# Patient Record
Sex: Female | Born: 1985 | Race: White | Hispanic: No | Marital: Single | Smoking: Former smoker
Health system: Southern US, Community
[De-identification: ages and names within clinical notes are randomized; demographics above are authoritative.]

---

## 2014-03-27 ENCOUNTER — Emergency Department (HOSPITAL_COMMUNITY): Payer: Self-pay

## 2014-03-27 ENCOUNTER — Emergency Department (HOSPITAL_COMMUNITY)
Admission: EM | Admit: 2014-03-27 | Discharge: 2014-03-27 | Disposition: A | Discharge status: Home / Self Care | Payer: Self-pay | Attending: Emergency Medicine | Admitting: Emergency Medicine

## 2014-03-27 ENCOUNTER — Encounter (HOSPITAL_COMMUNITY): Payer: Self-pay | Admitting: Emergency Medicine

## 2014-03-27 DIAGNOSIS — Z3202 Encounter for pregnancy test, result negative: Secondary | ICD-10-CM | POA: Insufficient documentation

## 2014-03-27 DIAGNOSIS — F17200 Nicotine dependence, unspecified, uncomplicated: Secondary | ICD-10-CM | POA: Insufficient documentation

## 2014-03-27 DIAGNOSIS — R112 Nausea with vomiting, unspecified: Secondary | ICD-10-CM | POA: Insufficient documentation

## 2014-03-27 DIAGNOSIS — R1011 Right upper quadrant pain: Secondary | ICD-10-CM | POA: Insufficient documentation

## 2014-03-27 LAB — URINE MICROSCOPIC-ADD ON

## 2014-03-27 LAB — COMPREHENSIVE METABOLIC PANEL
ALT: 12 U/L (ref 0–35)
AST: 15 U/L (ref 0–37)
Albumin: 3.7 g/dL (ref 3.5–5.2)
Alkaline Phosphatase: 54 U/L (ref 39–117)
Anion gap: 13 (ref 5–15)
BUN: 11 mg/dL (ref 6–23)
CALCIUM: 8.7 mg/dL (ref 8.4–10.5)
CO2: 21 mEq/L (ref 19–32)
CREATININE: 0.68 mg/dL (ref 0.50–1.10)
Chloride: 102 mEq/L (ref 96–112)
GFR calc non Af Amer: >90 mL/min (ref >90)
GLUCOSE: 102 mg/dL — AB (ref 70–99)
Potassium: 3.8 mEq/L (ref 3.7–5.3)
Sodium: 136 mEq/L — ABNORMAL LOW (ref 137–147)
TOTAL PROTEIN: 6.7 g/dL (ref 6.0–8.3)
Total Bilirubin: 0.3 mg/dL (ref 0.3–1.2)

## 2014-03-27 LAB — CBC WITH DIFFERENTIAL/PLATELET
BASOS PCT: 1 % (ref 0–1)
Basophils Absolute: 0.1 10*3/uL (ref 0.0–0.1)
EOS ABS: 0.2 10*3/uL (ref 0.0–0.7)
EOS PCT: 3 % (ref 0–5)
HCT: 38.6 % (ref 36.0–46.0)
Hemoglobin: 13 g/dL (ref 12.0–15.0)
Lymphocytes Relative: 36 % (ref 12–46)
Lymphs Abs: 2.3 10*3/uL (ref 0.7–4.0)
MCH: 31.7 pg (ref 26.0–34.0)
MCHC: 33.7 g/dL (ref 30.0–36.0)
MCV: 94.1 fL (ref 78.0–100.0)
MONOS PCT: 8 % (ref 3–12)
Monocytes Absolute: 0.5 10*3/uL (ref 0.1–1.0)
Neutro Abs: 3.4 10*3/uL (ref 1.7–7.7)
Neutrophils Relative %: 52 % (ref 43–77)
Platelets: 189 10*3/uL (ref 150–400)
RBC: 4.1 MIL/uL (ref 3.87–5.11)
RDW: 13.5 % (ref 11.5–15.5)
WBC: 6.4 10*3/uL (ref 4.0–10.5)

## 2014-03-27 LAB — URINALYSIS, ROUTINE W REFLEX MICROSCOPIC
Bilirubin Urine: NEGATIVE
Glucose, UA: NEGATIVE mg/dL
HGB URINE DIPSTICK: NEGATIVE
Ketones, ur: NEGATIVE mg/dL
LEUKOCYTES UA: NEGATIVE
Nitrite: POSITIVE — AB
PROTEIN: NEGATIVE mg/dL
Specific Gravity, Urine: 1.021 (ref 1.005–1.030)
Urobilinogen, UA: 0.2 mg/dL (ref 0.0–1.0)
pH: 5.5 (ref 5.0–8.0)

## 2014-03-27 LAB — LIPASE, BLOOD: LIPASE: 31 U/L (ref 11–59)

## 2014-03-27 LAB — POC URINE PREG, ED: Preg Test, Ur: NEGATIVE

## 2014-03-27 MED ORDER — ONDANSETRON HCL 4 MG PO TABS: 4.0000 mg | ORAL_TABLET | Freq: Four times a day (QID) | ORAL | Status: AC

## 2014-03-27 MED ORDER — MORPHINE SULFATE 4 MG/ML IJ SOLN
4.0000 mg | Freq: Once | INTRAMUSCULAR | Status: AC
  Administered 2014-03-27: 4 mg via INTRAVENOUS
  Filled 2014-03-27: qty 1

## 2014-03-27 MED ORDER — ONDANSETRON HCL 4 MG/2ML IJ SOLN
4.0000 mg | Freq: Once | INTRAMUSCULAR | Status: AC
  Administered 2014-03-27: 4 mg via INTRAVENOUS
  Filled 2014-03-27: qty 2

## 2014-03-27 MED ORDER — DICYCLOMINE HCL 10 MG PO CAPS: 10.0000 mg | ORAL_CAPSULE | Freq: Once | ORAL | Status: DC

## 2014-03-27 MED ORDER — DICYCLOMINE HCL 20 MG PO TABS: 20.0000 mg | ORAL_TABLET | Freq: Two times a day (BID) | ORAL | Status: AC

## 2014-03-27 MED ORDER — OXYCODONE-ACETAMINOPHEN 5-325 MG PO TABS: 1.0000 | ORAL_TABLET | ORAL | Status: AC | PRN

## 2014-03-27 MED ORDER — SODIUM CHLORIDE 0.9 % IV BOLUS (SEPSIS)
1000.0000 mL | Freq: Once | INTRAVENOUS | Status: AC
  Administered 2014-03-27: 1000 mL via INTRAVENOUS

## 2014-03-27 NOTE — ED Notes (Signed)
Pt reports RUQ pain that started last night after eating japanese food. Feels like abd is bloating and having large amounts of gas and belching. Having nausea, denies vomiting or diarrhea.

## 2014-03-27 NOTE — Discharge Instructions (Signed)
Biliary Colic  °Biliary colic is a steady or irregular pain in the upper abdomen. It is usually under the right side of the rib cage. It happens when gallstones interfere with the normal flow of bile from the gallbladder. Bile is a liquid that helps to digest fats. Bile is made in the liver and stored in the gallbladder. When you eat a meal, bile passes from the gallbladder through the cystic duct and the common bile duct into the small intestine. There, it mixes with partially digested food. If a gallstone blocks either of these ducts, the normal flow of bile is blocked. The muscle cells in the bile duct contract forcefully to try to move the stone. This causes the pain of biliary colic.  °SYMPTOMS  °· A person with biliary colic usually complains of pain in the upper abdomen. This pain can be: °¨ In the center of the upper abdomen just below the breastbone. °¨ In the upper-right part of the abdomen, near the gallbladder and liver. °¨ Spread back toward the right shoulder blade. °· Nausea and vomiting. °· The pain usually occurs after eating. °· Biliary colic is usually triggered by the digestive system's demand for bile. The demand for bile is high after fatty meals. Symptoms can also occur when a person who has been fasting suddenly eats a very large meal. Most episodes of biliary colic pass after 1 to 5 hours. After the most intense pain passes, your abdomen may continue to ache mildly for about 24 hours. °DIAGNOSIS  °After you describe your symptoms, your caregiver will perform a physical exam. He or she will pay attention to the upper right portion of your belly (abdomen). This is the area of your liver and gallbladder. An ultrasound will help your caregiver look for gallstones. Specialized scans of the gallbladder may also be done. Blood tests may be done, especially if you have fever or if your pain persists. °PREVENTION  °Biliary colic can be prevented by controlling the risk factors for gallstones. Some of  these risk factors, such as heredity, increasing age, and pregnancy are a normal part of life. Obesity and a high-fat diet are risk factors you can change through a healthy lifestyle. Women going through menopause who take hormone replacement therapy (estrogen) are also more likely to develop biliary colic. °TREATMENT  °· Pain medication may be prescribed. °· You may be encouraged to eat a fat-free diet. °· If the first episode of biliary colic is severe, or episodes of colic keep retuning, surgery to remove the gallbladder (cholecystectomy) is usually recommended. This procedure can be done through small incisions using an instrument called a laparoscope. The procedure often requires a brief stay in the hospital. Some people can leave the hospital the same day. It is the most widely used treatment in people troubled by painful gallstones. It is effective and safe, with no complications in more than 90% of cases. °· If surgery cannot be done, medication that dissolves gallstones may be used. This medication is expensive and can take months or years to work. Only small stones will dissolve. °· Rarely, medication to dissolve gallstones is combined with a procedure called shock-wave lithotripsy. This procedure uses carefully aimed shock waves to break up gallstones. In many people treated with this procedure, gallstones form again within a few years. °PROGNOSIS  °If gallstones block your cystic duct or common bile duct, you are at risk for repeated episodes of biliary colic. There is also a 25% chance that you will develop   a gallbladder infection(acute cholecystitis), or some other complication of gallstones within 10 to 20 years. If you have surgery, schedule it at a time that is convenient for you and at a time when you are not sick. HOME CARE INSTRUCTIONS   Drink plenty of clear fluids.  Avoid fatty, greasy or fried foods, or any foods that make your pain worse.  Take medications as directed. SEEK MEDICAL  CARE IF:   You develop a fever over 100.5 F (38.1 C).  Your pain gets worse over time.  You develop nausea that prevents you from eating and drinking.  You develop vomiting. SEEK IMMEDIATE MEDICAL CARE IF:   You have continuous or severe belly (abdominal) pain which is not relieved with medications.  You develop nausea and vomiting which is not relieved with medications.  You have symptoms of biliary colic and you suddenly develop a fever and shaking chills. This may signal cholecystitis. Call your caregiver immediately.  You develop a yellow color to your skin or the white part of your eyes (jaundice). Document Released: 12/02/2005 Document Revised: 09/23/2011 Document Reviewed: 02/11/2008 [employer] Patient Information 2015 [place], [place]. This information is not intended to replace advice given to you by your health care provider. Make sure you discuss any questions you have with your health care provider.  Abdominal Pain Many things can cause belly (abdominal) pain. Most times, the belly pain is not dangerous. Many cases of belly pain can be watched and treated at home. HOME CARE   Do not take medicines that help you go poop (laxatives) unless told to by your doctor.  Only take medicine as told by your doctor.  Eat or drink as told by your doctor. Your doctor will tell you if you should be on a special diet. GET HELP IF:  You do not know what is causing your belly pain.  You have belly pain while you are sick to your stomach (nauseous) or have runny poop (diarrhea).  You have pain while you pee or poop.  Your belly pain wakes you up at night.  You have belly pain that gets worse or better when you eat.  You have belly pain that gets worse when you eat fatty foods.  You have a fever. GET HELP RIGHT AWAY IF:   The pain does not go away within 2 hours.  You keep throwing up (vomiting).  The pain changes and is only in the right or left part of the belly.  You  have bloody or tarry looking poop. MAKE SURE YOU:   Understand these instructions.  Will watch your condition.  Will get help right away if you are not doing well or get worse. Document Released: 12/18/2007 Document Revised: 07/06/2013 Document Reviewed: 03/10/2013 [facility] Patient Information 2015 [place], [place]. This information is not intended to replace advice given to you by your health care provider. Make sure you discuss any questions you have with your health care provider.   Emergency Department Resource Guide 1) Find a Doctor and Pay Out of Pocket Although you won't have to find out who is covered by your insurance plan, it is a good idea to ask around and get recommendations. You will then need to call the office and see if the doctor you have chosen will accept you as a new patient and what types of options they offer for patients who are self-pay. Some doctors offer discounts or will set up payment plans for their patients who do not have insurance, but you  will need to ask so you aren't surprised when you get to your appointment.  2) Contact Your Local Health Department Not all health departments have doctors that can see patients for sick visits, but many do, so it is worth a call to see if yours does. If you don't know where your local health department is, you can check in your phone book. The CDC also has a tool to help you locate your state's health department, and many state websites also have listings of all of their local health departments.  3) Find a Walk-in Clinic If your illness is not likely to be very severe or complicated, you may want to try a walk in clinic. These are popping up all over the country in pharmacies, drugstores, and shopping centers. They're usually staffed by nurse practitioners or physician assistants that have been trained to treat common illnesses and complaints. They're usually fairly quick and inexpensive. However, if you have serious medical  issues or chronic medical problems, these are probably not your best option.  No Primary Care Doctor: - Call Health Connect at  [phone] - they can help you locate a primary care doctor that  accepts your insurance, provides certain services, etc. - Physician Referral Service- [phone]  Chronic Pain Problems: Organization         Address  Phone   Notes  [facility]  [phone] Patients need to be referred by their primary care doctor.   Medication Assistance: Organization         Address  Phone   Notes  [facility] Medication [facility] [address] [place] [place], [place] [address] [phone] --Must be a resident of [facility] -- Must have NO insurance coverage whatsoever (no Medicaid/ Medicare, etc.) -- The pt. MUST have a primary care doctor that directs their care regularly and follows them in the community   [facility]  [phone]   [employer]  [phone]    Agencies that provide inexpensive medical care: Organization         Address  Phone   Notes  [facility]  [phone]   [facility]    [phone]   [facility] [address] [place], [place] [address] [phone]   [facility] [address] [place] [place] [phone]   Planned Parenthood    [phone]   [facility]    [phone]   [facility]  [address], [place] Phone:  [phone], Fax:  [phone] Hours of Operation:  9 am - 6 pm, M-F.  Also accepts Medicaid/Medicare and self-pay.  [facility]  [address], [place] Phone: [phone], Fax: [phone]. Hours of Operation:  8:30 am - 5:30 pm, M-F.  Also accepts Medicaid and self-pay.  [facility] [place] [address] [place] Phone: [phone]   [facility] [address] Natasha Bence [place], [place]  [phone], Ext. [phone] Mondays & Thursdays: 7-9 AM.  First 15 patients are seen on a first come, first serve basis.    Medicaid-accepting [facility] Providers:  Organization         Address  Phone   Notes  [facility] [address], [place] [phone] Also accepts self-pay patients.  [facility] [address] [place], [place] [address],  [place]  [phone]) [phone]   [facility] [address], [place] [phone]) [phone]   [facility] [address] [place] [address], [place] [phone]   Renaye RakersVeita Bland [address], [place]   [phone] Only accepts [place] Access [place] patients after they have their name applied to their card.   Self-Pay (no insurance) in [facility]:  Organization         Address  Phone   Notes  Sickle Cell Patients, [facility] [address], [place] [phone]   [facility] [address], [place] [phone]   [facility]  [address], [place] [phone]) [phone]   [facility]/Dr. Osei-Bonsu  [address] [place] [address], [place] or [phone], [place] [phone] Phone number for both [place] and [place] locations is the same.  [facility] [address], [place] [phone]   [facility] [address] [place] [address], [place] or [address] [phone]   [facility] [address], [place] [phone]) [phone], phone; [phone], fax Sees patients 1st and 3rd Saturday of every month.  Must not qualify for public or private insurance (i.e. Medicaid, Medicare, [place] Health Choice, Veterans' Benefits)  Household income should be no more than 200% of the poverty level The clinic cannot treat you if you are pregnant or think you are pregnant  Sexually transmitted  diseases are not treated at the clinic.    Dental Care: Organization         Address  Phone  Notes  [facility] [address], [place] [phone] Accepts children up to age 721 who are enrolled in [place] or [place] Health Choice; pregnant women with a Medicaid card; and children who have applied for Medicaid or [place] Health Choice, but were declined, whose parents can pay a reduced fee at time of service.  [facility] of [facility] [place]  [address], [place] [phone] Accepts children up to age 28 who are enrolled in [place] or [place] Health Choice; pregnant women with a Medicaid card; and children who have applied for Medicaid or [place] Health Choice, but were declined, whose parents can pay a reduced fee at time of service.  [facility]  [address], [place] [phone]) [phone] Patients are seen by appointment only. Walk-ins are not accepted. [facility] will see patients 28 years of age and older. Monday - Tuesday (8am-5pm) Most Wednesdays (8:30-5pm) $30 per visit, cash only  [facility]  [address], [facility] [place] [phone] Patients are seen by appointment only. Walk-ins are not accepted. [facility] will see patients 28 years of age and older. One Wednesday Evening (Monthly: Volunteer Based).  $30 per visit, cash only  [facility]  [phone] for adults; Children under age 184, call Graduate Pediatric Dentistry at [phone]. Children aged 64-14, please call [phone] to request a pediatric application.  Dental services are provided in all areas of dental care including fillings, crowns and bridges, complete and partial dentures, implants, gum treatment, root canals, and extractions. Preventive care is also provided. Treatment is provided to both adults and children. Patients are selected via a  lottery and  there is often a waiting list.   [facility] [address], [place]  [phone] [email or web address]   [facility] [address] [place], [place] [phone], Ext. [phone] Second and Fourth Thursday of each month, opens at 6:30 AM; Clinic ends at 9 AM.  Patients are seen on a first-come first-served basis, and a limited number are seen during each clinic.   [facility]  [address] [place], [place] [phone]   Eligibility Requirements You must have lived in [place], [place], or [place] counties for at least the last three months.   You cannot be eligible for state or federal sponsored [place], including [employer], [place], or [employer].   You generally cannot be eligible for healthcare insurance through your employer.    How to apply: Eligibility screenings are held every Tuesday and Wednesday afternoon from 1:00 pm until 4:00 pm. You do not need an appointment for the interview!  [facility] [address] [place], [place] [phone]   [facility] Health Department  [phone]   [facility] Health Department  [phone]   [facility] Health Department  [phone]    Behavioral Health Resources in the Community: Intensive Outpatient Programs Organization         Address  Phone  Notes  [facility] [address], [place], [place] [phone]   [facility]patient [address] [place], [place] [phone]   [facility] [address] [place], [place]  [phone]   [facility] Mental Health [address]  [place], [place] [phone] or [phone]   Substance Abuse Resources Organization         Address  Phone  Notes  [facility] Drug [facility]  [phone]   [facility]  [phone]   [facility] [place]  [phone]   Floydene Flock  [phone]   Residential &  Outpatient Substance Abuse Program  [phone]   Psychological Services Organization         Address  Phone  Notes  [facility] Health  [phone]   [facility] Services  [phone]   [facility] [address], [place] [phone] or [phone]    Mobile Crisis Teams Organization         Address  Phone  Notes  [facility], Mobile Crisis Care Unit  [phone]   [facility]  [address] [place], [place] [phone]   Doristine Locks [address] [place] [phone]    Self-Help/Support Groups Organization         Address  Phone             Notes  [facility] [place] - variety of support groups  [phone]- I7437963 Call for more information  Narcotics Anonymous (NA), [facility] [address], [employer] [place]  2 meetings at this location   Statistician         Address  Phone  Notes  [facility] [address],    [place]  [phone]   [facility]  [address] [place] [address], [place], [place] [phone]   [facility] [address] [place], [place] [phone] Admissions: 8am-3pm M-F  [facility] [address]    [place], [place] [phone]   [facility] [address] Olivia Lopez de Gutierrez, [place], [place] [phone]  [facility] [address],  [place], [place] [phone]   [facility] - Intensive Outpatient [address]urell Josephs [address], [place], [place] [phone]   [facility] ([facility] [address]  [place], [place] [phone] or [phone]   [facility] ([facility]) [address], [place], [place] [phone] Accepts Medicaid  [facility] [place] [address],  [place] [phone] Substance Abuse/Addiction Treatment   [facility] Organization          Address  Phone  Notes  [facility]  [phone]   Angie Fava, PhD [address] Ervin Knack [place], [place]   [phone] or [phone]   [facility]al   [address] [place], [place] [phone]   [facility] [address] [place], [place] [phone] Insurance/Medicaid/sponsorship through [facility] and Families [address]                                    [place], [place] [phone] Therapy/tele-psych/case  [facility] [address], [place] [phone]    Dr. Lolly Mustache  [phone]   [facility]  [employer] [facility] Dept. 1) [address], [place] 2) [address] [place] 3)  [address], [place] [phone]  [phone]   [facility] Child Abuse Hotline [phone] or [phone] (After Hours)

## 2014-03-27 NOTE — ED Provider Notes (Signed)
CSN: [id number]     Arrival date & time 03/27/14  1606 History   First MD Initiated Contact with Patient 03/27/14 1650     Chief Complaint  Patient presents with  . Abdominal Pain     (Consider location/radiation/quality/duration/timing/severity/associated sxs/prior Treatment) HPI Comments: Patient is a 28 year old female who presents to the emergency department for evaluation of abdominal pain. She reports she has been having constant, gradually worsening, sharp RUQ pain since last night. The pain began shortly after eating [place] food. She's had associated bloating and gas. She has an increase in belching. She has associated nausea without vomiting or diarrhea. She took an antacid without relief of her symptoms. She then tried using vinegar without success. She has had symptoms like this in the past after eating fatty foods. She has not had anything that has lasted this long in the past. She denies any prior abdominal surgeries. She still has her gallbladder.  Patient is a 28 y.o. female presenting with abdominal pain. The history is provided by the patient. No language interpreter was used.  Abdominal Pain Associated symptoms: nausea   Associated symptoms: no chest pain, no chills, no diarrhea, no fever, no shortness of breath and no vomiting     History reviewed. No pertinent past medical history. History reviewed. No pertinent past surgical history. History reviewed. No pertinent family history. History  Substance Use Topics  . Smoking status: Current Every Day Smoker    Types: Cigarettes  . Smokeless tobacco: Not on file  . Alcohol Use: Yes     Comment: occ beer   OB History   Grav Para Term Preterm Abortions TAB SAB Ect Mult Living                 Review of Systems  Constitutional: Negative for fever and chills.  Respiratory: Negative for shortness of breath.   Cardiovascular: Negative for chest pain.  Gastrointestinal: Positive for nausea and abdominal pain. Negative  for vomiting and diarrhea.  All other systems reviewed and are negative.     Allergies  Review of patient's allergies indicates no known allergies.  Home Medications   Prior to Admission medications   Not on File   BP 128/78  Pulse 99  Temp(Src) 97.7 F (36.5 C) (Oral)  Resp 16  Ht  (1.626 m)  Wt 130 lb (58.968 kg)  BMI 22.30 kg/m2  SpO2 98%  LMP 02/21/2014 Physical Exam  Nursing note and vitals reviewed. Constitutional: She is oriented to person, place, and time. She appears well-developed and well-nourished. No distress.  HENT:  Head: Normocephalic and atraumatic.  Right Ear: External ear normal.  Left Ear: External ear normal.  Nose: Nose normal.  Mouth/Throat: Oropharynx is clear and moist.  Eyes: Conjunctivae are normal.  Neck: Normal range of motion.  Cardiovascular: Normal rate, regular rhythm and normal heart sounds.   Pulmonary/Chest: Effort normal and breath sounds normal. No stridor. No respiratory distress. She has no wheezes. She has no rales.  Abdominal: Soft. She exhibits no distension. There is tenderness in the right upper quadrant. There is no rigidity, no rebound, no guarding and negative Murphy's sign.  Musculoskeletal: Normal range of motion.  Neurological: She is alert and oriented to person, place, and time. She has normal strength.  Skin: Skin is warm and dry. She is not diaphoretic. No erythema.  Psychiatric: She has a normal mood and affect. Her behavior is normal.    ED Course  Procedures (including critical care time)  Labs Review Labs Reviewed  COMPREHENSIVE METABOLIC PANEL - Abnormal; Notable for the following:    Sodium 136 (*)    Glucose, Bld 102 (*)    All other components within normal limits  URINALYSIS, ROUTINE W REFLEX MICROSCOPIC - Abnormal; Notable for the following:    Nitrite POSITIVE (*)    All other components within normal limits  URINE MICROSCOPIC-ADD ON - Abnormal; Notable for the following:    Bacteria, UA MANY  (*)    All other components within normal limits  URINE CULTURE  CBC WITH DIFFERENTIAL  LIPASE, BLOOD  POC URINE PREG, ED    Imaging Review US Abdomen Complete  03/27/2014   CLINICAL DATA:  Right upper quadrant pain.  EXAM: ULTRASOUND ABDOMEN COMPLETE  COMPARISON:  None.  FINDINGS: Gallbladder:  Gallbladder has a normal appearance. Gallbladder wall is 2.1 mm, within normal limits. No stones or pericholecystic fluid. No sonographic Murphy's sign.  Common bile duct:  Diameter: 2.2 mm  Liver:  No focal lesion identified. Within normal limits in parenchymal echogenicity.  IVC:  No abnormality visualized.  Pancreas:  Visualized portion unremarkable.  Spleen:  Size and appearance within normal limits.  Right Kidney:  Length: 10.0 cm. Echogenicity within normal limits. No mass or hydronephrosis visualized.  Left Kidney:  Length: 10.2 cm. Echogenicity within normal limits. No mass or hydronephrosis visualized.  Abdominal aorta:  1.6 cm  Other findings:  None.  IMPRESSION: No evidence for acute  abnormality.   Electronically Signed   By: Rosalie Gums M.D.   On: 03/27/2014 20:58     EKG Interpretation None      MDM   Final diagnoses:  Right upper quadrant abdominal pain    Patient is nontoxic, nonseptic appearing, in no apparent distress.  Patient's pain and other symptoms adequately managed in emergency department.  Fluid bolus given.  Labs, imaging and vitals reviewed.  Patient does not meet the SIRS or Sepsis criteria.  On repeat exam patient does not have a surgical abdomin and there are no peritoneal signs.  No indication of appendicitis, bowel obstruction, bowel perforation, cholecystitis, diverticulitis, PID or ectopic pregnancy.  I believe patient's sx are likely related to biliary colic. Encouraged patient follow up with PCP for further workup. Patient discharged home with symptomatic treatment.  I have also discussed reasons to return immediately to the ER.  Patient expresses understanding and  agrees with plan. Discussed case with Dr. Rubin Payor who agrees with plan. Patient / Family / Caregiver informed of clinical course, understand medical decision-making process, and agree with plan.        Mora Bellman, PA-C 03/27/14 2322

## 2014-03-27 NOTE — ED Provider Notes (Signed)
Medical screening examination/treatment/procedure(s) were performed by non-physician practitioner and as supervising physician I was immediately available for consultation/collaboration.   EKG Interpretation None       Juliet Rude. Rubin Payor, MD 03/27/14 [phone]

## 2014-03-31 LAB — URINE CULTURE: Colony Count: >=100000

## 2014-04-01 ENCOUNTER — Telehealth (HOSPITAL_BASED_OUTPATIENT_CLINIC_OR_DEPARTMENT_OTHER): Payer: Self-pay | Admitting: Emergency Medicine

## 2014-04-01 NOTE — Telephone Encounter (Signed)
Post ED Visit - Positive Culture Follow-up  Culture report reviewed by antimicrobial stewardship pharmacist:  Wes Dulaney, Pharm.D., BCPS  Celedonio Miyamoto, Pharm.D., BCPS  Georgina Pillion, [address].D., BCPS  [place], [address].D., BCPS, AAHIVP  Estella Husk, Pharm.D., BCPS, AAHIVP  Carly Sabat, Pharm.D.  Enzo Bi, [address].D.  Positive urine culture >100,000 colonies Staphylococcus coag negative, E. Coli Treated with none, followup with Arthor Captain PA, no further patient follow-up is required at this time.  Berle Mull 04/01/2014, 11:53 AM

## 2015-07-10 ENCOUNTER — Emergency Department (HOSPITAL_COMMUNITY)
Admission: EM | Admit: 2015-07-10 | Discharge: 2015-07-10 | Disposition: A | Discharge status: Home / Self Care | Payer: Medicaid - Out of State | Attending: Emergency Medicine | Admitting: Emergency Medicine

## 2015-07-10 ENCOUNTER — Encounter (HOSPITAL_COMMUNITY): Payer: Self-pay | Admitting: *Deleted

## 2015-07-10 DIAGNOSIS — Z79899 Other long term (current) drug therapy: Secondary | ICD-10-CM | POA: Diagnosis not present

## 2015-07-10 DIAGNOSIS — R51 Headache: Secondary | ICD-10-CM | POA: Diagnosis not present

## 2015-07-10 DIAGNOSIS — F17210 Nicotine dependence, cigarettes, uncomplicated: Secondary | ICD-10-CM | POA: Insufficient documentation

## 2015-07-10 DIAGNOSIS — I889 Nonspecific lymphadenitis, unspecified: Secondary | ICD-10-CM | POA: Diagnosis not present

## 2015-07-10 DIAGNOSIS — R220 Localized swelling, mass and lump, head: Secondary | ICD-10-CM | POA: Diagnosis present

## 2015-07-10 NOTE — ED Notes (Signed)
Pt reports a "knot" behind her rt ear. Pt states that it has been there for 4 days. Pt states that it is tender to palpation and movement.

## 2015-07-10 NOTE — ED Notes (Signed)
Declined W/C at D/C and was escorted to lobby by RN. 

## 2015-07-10 NOTE — ED Notes (Signed)
Called patient x2 with no answer

## 2015-07-10 NOTE — Discharge Instructions (Signed)

## 2015-07-10 NOTE — ED Provider Notes (Signed)
CSN: [id number]     Arrival date & time 07/10/15  1653 History  By signing my name below, I, Jarvis Morganaylor Ferguson, attest that this documentation has been prepared under the direction and in the presence of Roxy Horsemanobert Sebrena Engh, PA-C Electronically Signed: Jarvis Morganaylor Ferguson, ED Scribe. 07/10/2015. 5:38 PM.    No chief complaint on file.  The history is provided by the patient. No language interpreter was used.    Jon GillsHeather D Demetrius is a 29 y.o. female who presents to the Emergency Department with a chief complaint of a knot behind her right ear onset 4 days. She states the pain radiates throughout her entire head. She reports the area is tender to palpation and is exacerbated with movement of her head. Pt has been taking Ibuprofen with only moderate relief. She denies any recent illness. Pt notes she has not had anything like this in the past. Pt denies any redness or warmth to the area, drainage, fever, or other associated symptoms  History reviewed. No pertinent past medical history. History reviewed. No pertinent past surgical history. No family history on file. Social History  Substance Use Topics  . Smoking status: Current Every Day Smoker    Types: Cigarettes  . Smokeless tobacco: None  . Alcohol Use: Yes     Comment: occ beer   OB History    No data available     Review of Systems  HENT: Negative for ear pain.   Skin:       "knot" behind right ear  Neurological: Positive for headaches.      Allergies  Codeine  Home Medications   Prior to Admission medications   Medication Sig Start Date End Date Taking? Authorizing Provider  calcium carbonate (TUMS - DOSED IN MG ELEMENTAL CALCIUM) 500 MG chewable tablet Chew 1 tablet by mouth daily as needed for indigestion or heartburn.    Historical Provider, MD  dicyclomine (BENTYL) 20 MG tablet Take 1 tablet (20 mg total) by mouth 2 (two) times daily. 03/27/14   Junious SilkHannah Merrell, PA-C  ondansetron (ZOFRAN) 4 MG tablet Take 1 tablet (4 mg total)  by mouth every 6 (six) hours. 03/27/14   Junious SilkHannah Merrell, PA-C  oxyCODONE-acetaminophen (PERCOCET/ROXICET) 5-325 MG per tablet Take 1 tablet by mouth every 4 (four) hours as needed for severe pain. May take 2 tablets PO q 6 hours for severe pain - Do not take with Tylenol as this tablet already contains tylenol 03/27/14   Junious SilkHannah Merrell, PA-C   Triage Vitals: BP 121/76 mmHg  Pulse 81  Temp(Src) 98.5 F (36.9 C) (Oral)  Resp 16  SpO2 97%  Physical Exam  Constitutional: She is oriented to person, place, and time. She appears well-developed and well-nourished. No distress.  HENT:  Head: Normocephalic and atraumatic.  Swollen tender right postauricular lymph node, no surrounding cellulitis, erythema, or sign of abscess or infection Right TM is clear  Eyes: Conjunctivae and EOM are normal.  Neck: Neck supple. No tracheal deviation present.  Cardiovascular: Normal rate.   Pulmonary/Chest: Effort normal. No respiratory distress.  Musculoskeletal: Normal range of motion.  Neurological: She is alert and oriented to person, place, and time.  Skin: Skin is warm and dry.  Psychiatric: She has a normal mood and affect. Her behavior is normal.  Nursing note and vitals reviewed.   ED Course  Procedures (including critical care time)  DIAGNOSTIC STUDIES: Oxygen Saturation is 97% on RA, normal by my interpretation.    COORDINATION OF CARE:   MDM  Final diagnoses:  Lymphadenitis    Patient with right swollen postauricular lymph node. Treat with warm compresses, Tylenol, and ibuprofen.  I personally performed the services described in this documentation, which was scribed in my presence. The recorded information has been reviewed and is accurate.      Roxy Horseman, PA-C 07/10/15 1759  Derwood Kaplan, MD 07/11/15 [phone]

## 2016-02-26 IMAGING — US US ABDOMEN COMPLETE
1 series · 14 of 25 positions shown · non-contrast
Comparison: None.

CLINICAL DATA: Right upper quadrant pain.

EXAM:
ULTRASOUND ABDOMEN COMPLETE

[Series 1: us abdomen complete · 0.22mm/px · 14 of 61 slices shown]
[im 1/61]
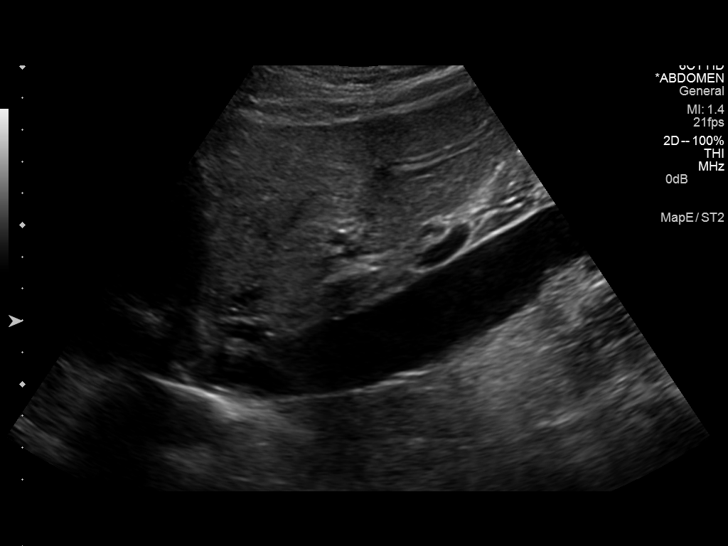
[im 6/61]
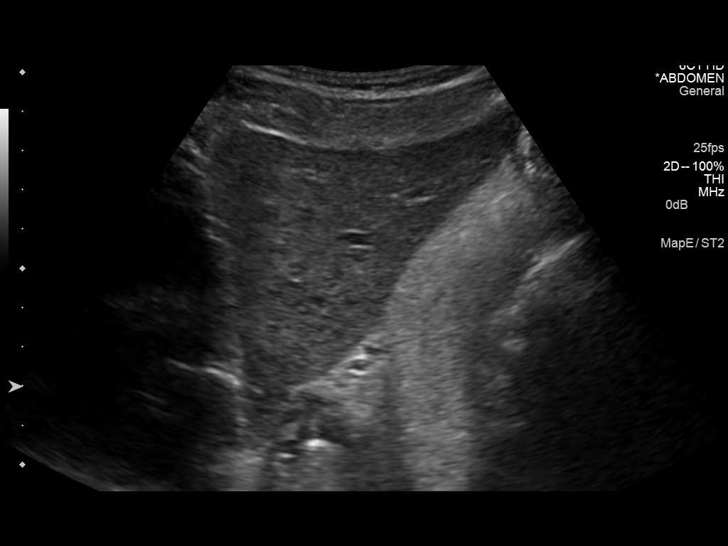
[im 11/61]
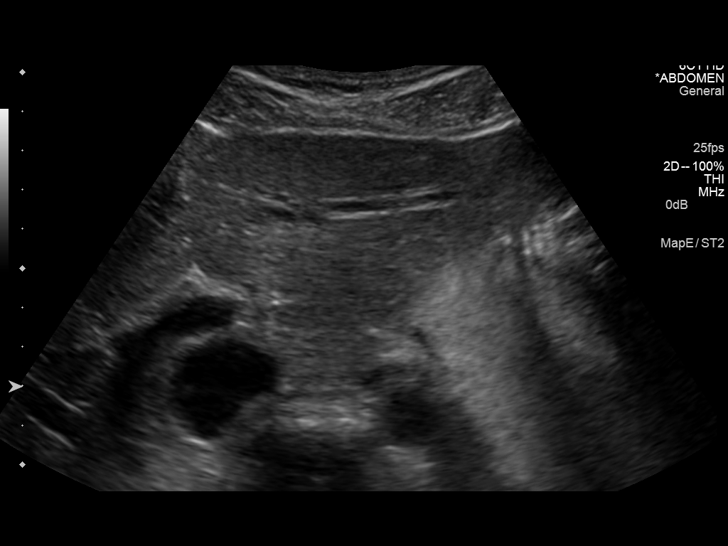
[im 16/61]
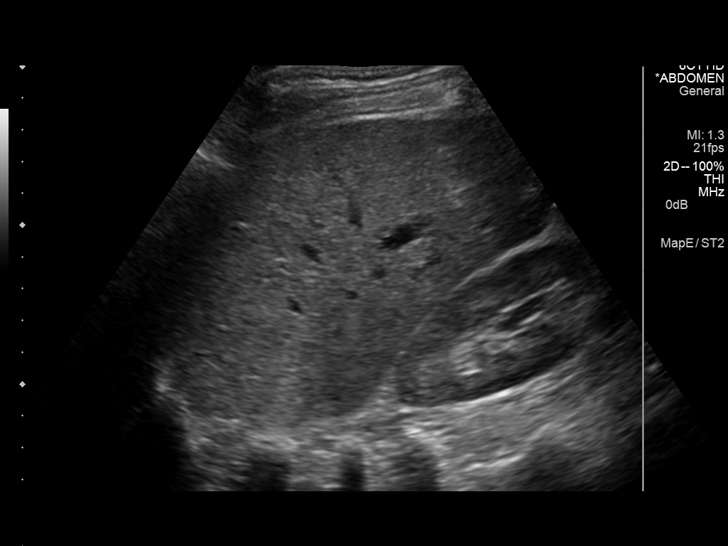
[im 21/61]
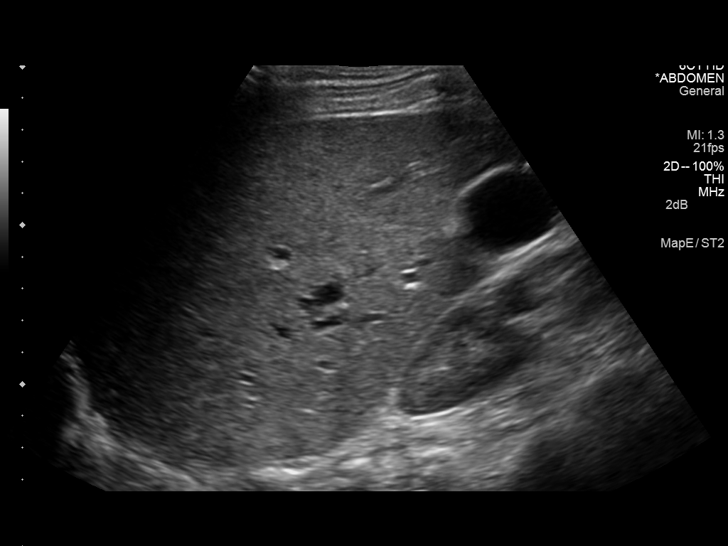
[im 23/61]
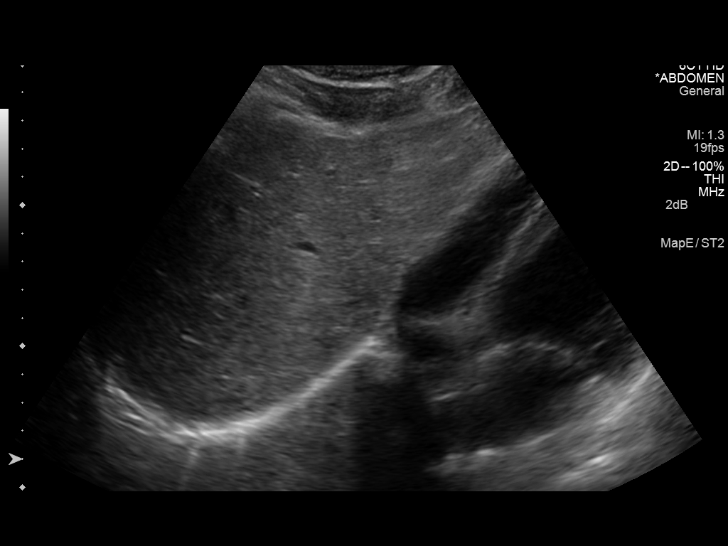
[im 28/61]
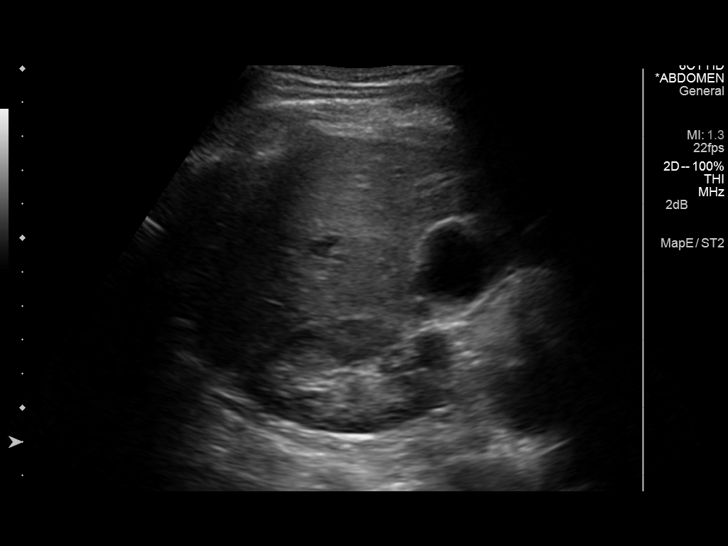
[im 33/61]
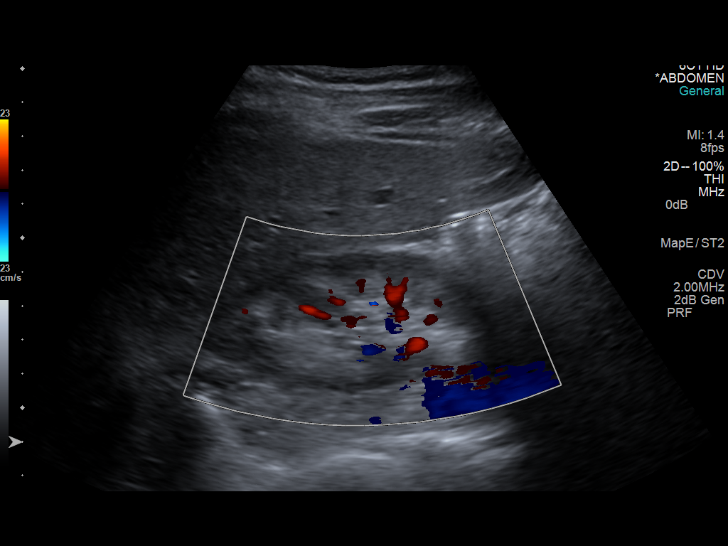
[im 38/61]
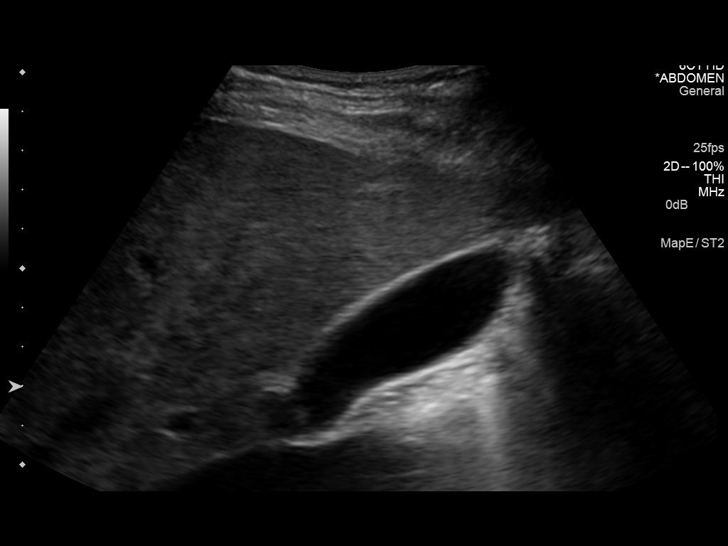
[im 41/61]
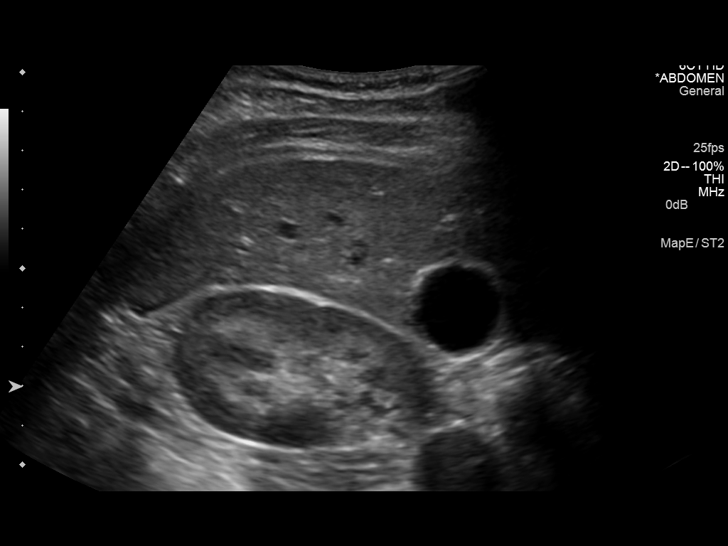
[im 46/61]
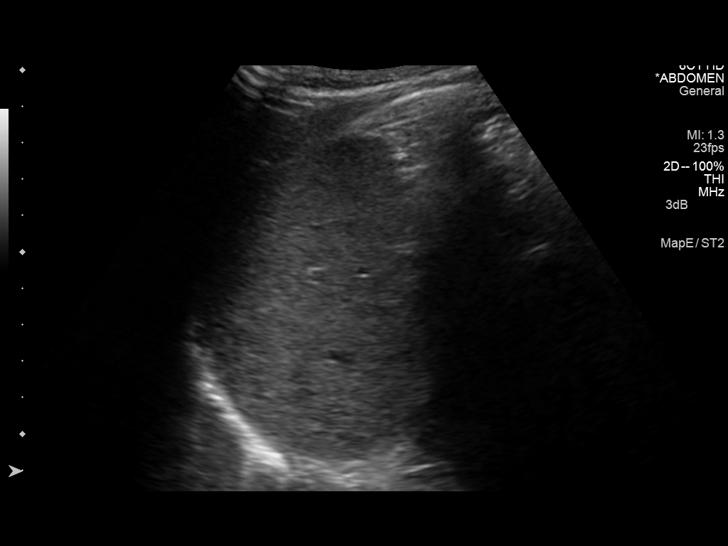
[im 51/61]
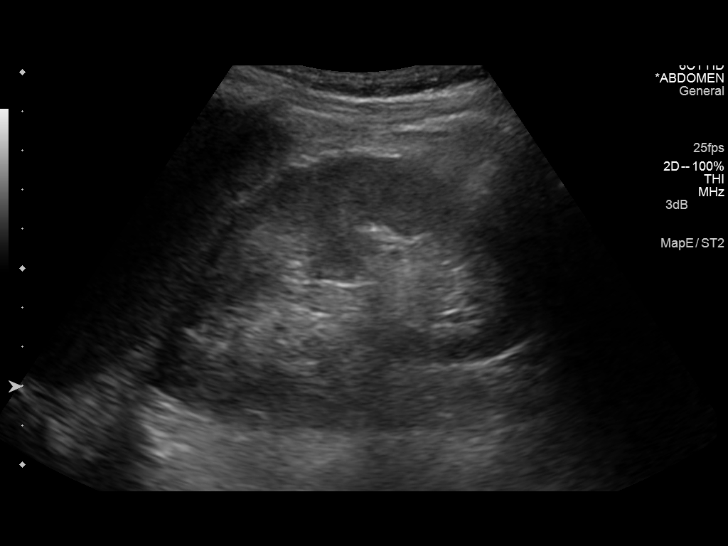
[im 56/61]
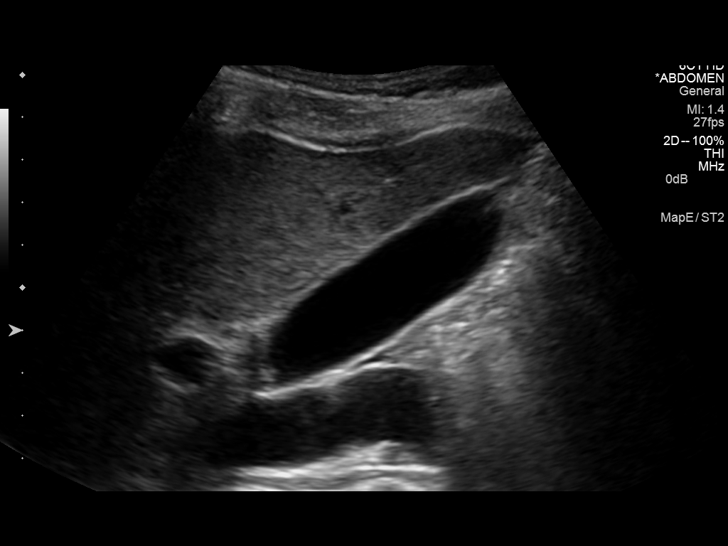
[im 61/61]
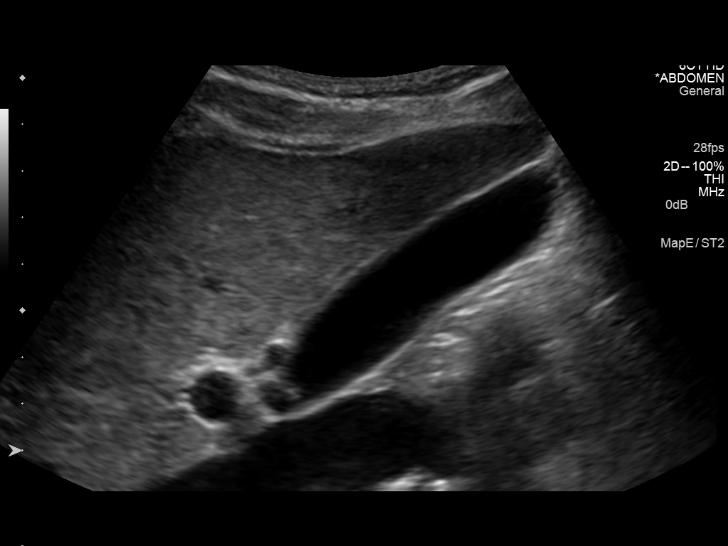

[14 of 25 positions shown; findings below may reference images not displayed]

FINDINGS: Gallbladder:

Gallbladder has a normal appearance. Gallbladder wall is 2.1 mm,
within normal limits. No stones or pericholecystic fluid. No
sonographic Murphy's sign.

Common bile duct:

Diameter: 2.2 mm

Liver:

No focal lesion identified. Within normal limits in parenchymal
echogenicity.

IVC:

No abnormality visualized.

Pancreas:

Visualized portion unremarkable.

Spleen:

Size and appearance within normal limits.

Right Kidney:

Length: 10.0 cm. Echogenicity within normal limits. No mass or
hydronephrosis visualized.

Left Kidney:

Length: 10.2 cm. Echogenicity within normal limits. No mass or
hydronephrosis visualized.

Abdominal aorta:

1.6 cm

Other findings:

None.
IMPRESSION: No evidence for acute  abnormality.

## 2016-02-26 IMAGING — US US ABDOMEN COMPLETE
1 series · 3 of 3 positions shown · non-contrast
Comparison: None.

CLINICAL DATA: Right upper quadrant pain.

EXAM:
ULTRASOUND ABDOMEN COMPLETE

[Series 1: us abdomen complete · 0.21mm/px · 3 of 3 slices shown]
[im 1/3]
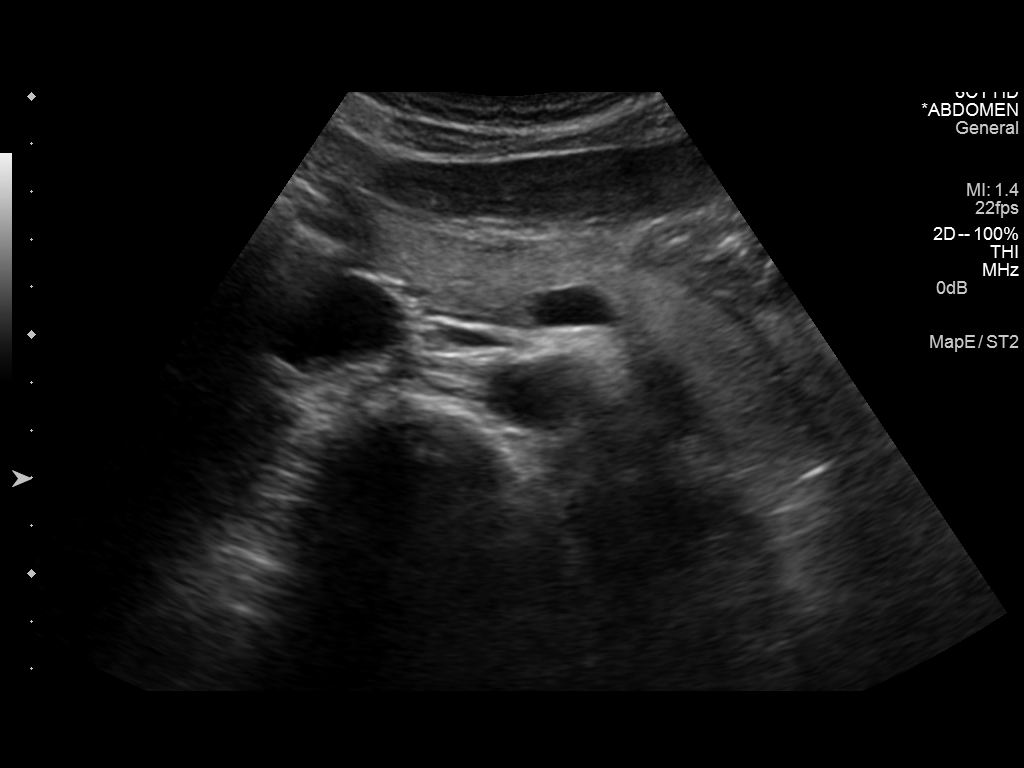
[im 2/3]
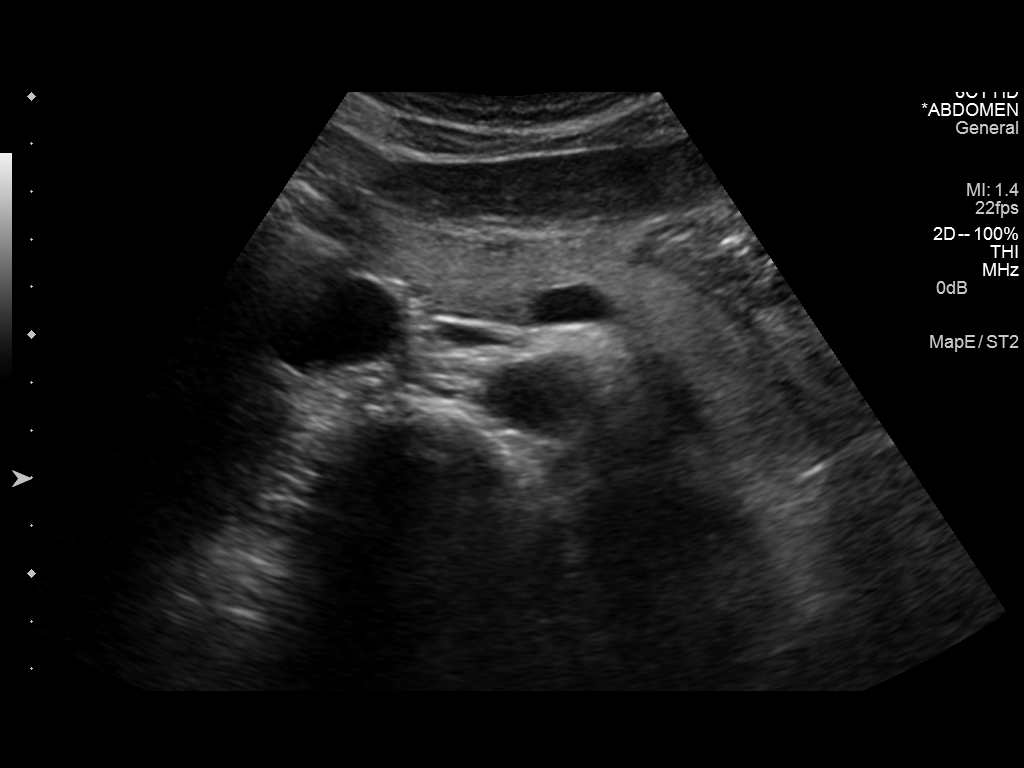
[im 3/3]
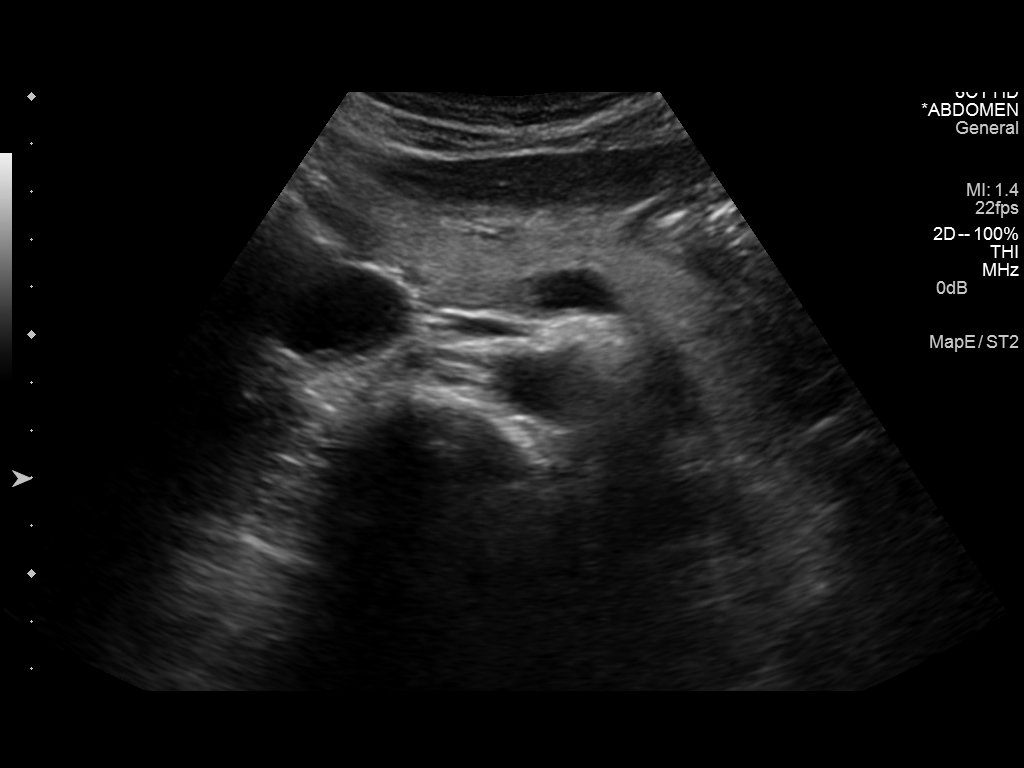

[3 of 3 positions shown; findings below may reference images not displayed]

FINDINGS: Gallbladder:

Gallbladder has a normal appearance. Gallbladder wall is 2.1 mm,
within normal limits. No stones or pericholecystic fluid. No
sonographic Murphy's sign.

Common bile duct:

Diameter: 2.2 mm

Liver:

No focal lesion identified. Within normal limits in parenchymal
echogenicity.

IVC:

No abnormality visualized.

Pancreas:

Visualized portion unremarkable.

Spleen:

Size and appearance within normal limits.

Right Kidney:

Length: 10.0 cm. Echogenicity within normal limits. No mass or
hydronephrosis visualized.

Left Kidney:

Length: 10.2 cm. Echogenicity within normal limits. No mass or
hydronephrosis visualized.

Abdominal aorta:

1.6 cm

Other findings:

None.
IMPRESSION: No evidence for acute  abnormality.

## 2024-03-01 ENCOUNTER — Emergency Department (HOSPITAL_COMMUNITY)
Admission: EM | Admit: 2024-03-01 | Discharge: 2024-03-01 | Disposition: A | Discharge status: Home / Self Care | Attending: Emergency Medicine | Admitting: Emergency Medicine

## 2024-03-01 ENCOUNTER — Other Ambulatory Visit: Payer: Self-pay

## 2024-03-01 ENCOUNTER — Emergency Department (HOSPITAL_COMMUNITY)

## 2024-03-01 ENCOUNTER — Encounter (HOSPITAL_COMMUNITY): Payer: Self-pay | Admitting: Emergency Medicine

## 2024-03-01 DIAGNOSIS — R42 Dizziness and giddiness: Secondary | ICD-10-CM | POA: Diagnosis present

## 2024-03-01 LAB — COMPREHENSIVE METABOLIC PANEL WITH GFR
ALT: 17 U/L (ref 0–44)
AST: 18 U/L (ref 15–41)
Albumin: 3.6 g/dL (ref 3.5–5.0)
Alkaline Phosphatase: 37 U/L — ABNORMAL LOW (ref 38–126)
Anion gap: 9 (ref 5–15)
BUN: 12 mg/dL (ref 6–20)
CO2: 24 mmol/L (ref 22–32)
Calcium: 8.5 mg/dL — ABNORMAL LOW (ref 8.9–10.3)
Chloride: 105 mmol/L (ref 98–111)
Creatinine, Ser: 0.78 mg/dL (ref 0.44–1.00)
GFR, Estimated: >60 mL/min
Glucose, Bld: 104 mg/dL — ABNORMAL HIGH (ref 70–99)
Potassium: 3.9 mmol/L (ref 3.5–5.1)
Sodium: 138 mmol/L (ref 135–145)
Total Bilirubin: <0.2 mg/dL (ref 0.0–1.2)
Total Protein: 6.4 g/dL — ABNORMAL LOW (ref 6.5–8.1)

## 2024-03-01 LAB — URINALYSIS, ROUTINE W REFLEX MICROSCOPIC
Bilirubin Urine: NEGATIVE
Glucose, UA: NEGATIVE mg/dL
Hgb urine dipstick: NEGATIVE
Ketones, ur: NEGATIVE mg/dL
Leukocytes,Ua: NEGATIVE
Nitrite: NEGATIVE
Protein, ur: NEGATIVE mg/dL
Specific Gravity, Urine: 1.01 (ref 1.005–1.030)
pH: 7 (ref 5.0–8.0)

## 2024-03-01 LAB — CBC
HCT: 38.7 % (ref 36.0–46.0)
Hemoglobin: 12.9 g/dL (ref 12.0–15.0)
MCH: 32.3 pg (ref 26.0–34.0)
MCHC: 33.3 g/dL (ref 30.0–36.0)
MCV: 97 fL (ref 80.0–100.0)
Platelets: 156 K/uL (ref 150–400)
RBC: 3.99 MIL/uL (ref 3.87–5.11)
RDW: 12.8 % (ref 11.5–15.5)
WBC: 5.8 K/uL (ref 4.0–10.5)
nRBC: 0 % (ref 0.0–0.2)

## 2024-03-01 LAB — POC URINE PREG, ED: Preg Test, Ur: NEGATIVE

## 2024-03-01 MED ORDER — SODIUM CHLORIDE 0.9 % IV BOLUS
1000.0000 mL | Freq: Once | INTRAVENOUS | Status: AC
  Administered 2024-03-01: 1000 mL via INTRAVENOUS

## 2024-03-01 MED ORDER — LORAZEPAM 1 MG PO TABS: 1.0000 mg | ORAL_TABLET | Freq: Three times a day (TID) | ORAL | 0 refills | Status: AC | PRN

## 2024-03-01 MED ORDER — KETOROLAC TROMETHAMINE 30 MG/ML IJ SOLN
15.0000 mg | Freq: Once | INTRAMUSCULAR | Status: AC
  Administered 2024-03-01: 15 mg via INTRAVENOUS
  Filled 2024-03-01: qty 1

## 2024-03-01 MED ORDER — MECLIZINE HCL 12.5 MG PO TABS
25.0000 mg | ORAL_TABLET | Freq: Once | ORAL | Status: AC
  Administered 2024-03-01: 25 mg via ORAL
  Filled 2024-03-01: qty 2

## 2024-03-01 MED ORDER — LORAZEPAM 0.5 MG PO TABS
0.5000 mg | ORAL_TABLET | Freq: Once | ORAL | Status: AC
Start: 2024-03-01 — End: 2024-03-01
  Administered 2024-03-01: 0.5 mg via ORAL
  Filled 2024-03-01: qty 1

## 2024-03-01 MED ORDER — MECLIZINE HCL 25 MG PO TABS
25.0000 mg | ORAL_TABLET | Freq: Three times a day (TID) | ORAL | 0 refills | Status: AC | PRN
Start: 2024-03-01 — End: ?

## 2024-03-01 NOTE — ED Provider Notes (Signed)
 [place] EMERGENCY DEPARTMENT AT [facility] Provider Note   CSN: [id number] Arrival date & time: 03/01/24  9148     Patient presents with: Dizziness   Molly Rhodes is a 38 y.o. female with a distant history of vertigo, presenting for evaluation of vertigo-like symptoms which started yesterday evening.  She describes feeling somewhat nauseated after eating her evening meal, the nausea has been waxing and waning since but she developed vertigo-like symptoms described as a room spinning worse with positional changes.  She also endorses a mild generalized headache, has had no recent cold like symptoms, sinus complaints.  Symptoms are similar to prior episodes of vertigo.  She borrowed a meclizine  tablet from her mother which did not resolve her symptoms.  She denies fevers or chills, no vomiting, no vision changes.  She does report having intermittent tinnitus for months, no ear pain, no decreased hearing or visual acuity.  No fevers or chills, neck pain or stiffness.  No focal weakness.   The history is provided by the patient.       Prior to Admission medications   Medication Sig Start Date End Date Taking? Authorizing Provider  LORazepam  (ATIVAN ) 1 MG tablet Take 1 tablet (1 mg total) by mouth 3 (three) times daily as needed (dizziness). 03/01/24  Yes Simran Mannis, PA-C  meclizine  (ANTIVERT ) 25 MG tablet Take 1 tablet (25 mg total) by mouth 3 (three) times daily as needed for dizziness. 03/01/24  Yes Cadance Raus, PA-C  calcium carbonate (TUMS - DOSED IN MG ELEMENTAL CALCIUM) 500 MG chewable tablet Chew 1 tablet by mouth daily as needed for indigestion or heartburn.    [provider]  dicyclomine  (BENTYL ) 20 MG tablet Take 1 tablet (20 mg total) by mouth 2 (two) times daily. 03/27/14   Remonia Lacks, PA-C  ondansetron  (ZOFRAN ) 4 MG tablet Take 1 tablet (4 mg total) by mouth every 6 (six) hours. 03/27/14   Remonia Lacks, PA-C  oxyCODONE -acetaminophen  (PERCOCET/ROXICET)  5-325 MG per tablet Take 1 tablet by mouth every 4 (four) hours as needed for severe pain. May take 2 tablets PO q 6 hours for severe pain - Do not take with Tylenol  as this tablet already contains tylenol  03/27/14   Remonia Lacks, PA-C    Allergies: Codeine    Review of Systems  Constitutional:  Negative for chills and fever.  HENT:  Positive for tinnitus. Negative for congestion and sore throat.   Eyes:  Positive for visual disturbance. Negative for photophobia.  Respiratory:  Negative for chest tightness and shortness of breath.   Cardiovascular:  Negative for chest pain.  Gastrointestinal:  Positive for nausea. Negative for abdominal pain and vomiting.  Genitourinary: Negative.   Musculoskeletal:  Negative for arthralgias, joint swelling and neck pain.  Skin: Negative.  Negative for rash and wound.  Neurological:  Positive for dizziness. Negative for weakness, light-headedness, numbness and headaches.  Psychiatric/Behavioral: Negative.      Updated Vital Signs BP (!) 93/57   Pulse (!) 57   Temp 97.8 F (36.6 C) (Oral)   Resp 18   Ht 5' 4 (1.626 m)   Wt 60 kg   SpO2 97%   BMI 22.71 kg/m   Physical Exam Vitals and nursing note reviewed.  Constitutional:      Appearance: She is well-developed.  HENT:     Head: Normocephalic and atraumatic.     Right Ear: Tympanic membrane normal.     Left Ear: Tympanic membrane normal.  Eyes:  Extraocular Movements: Extraocular movements intact.     Right eye: No nystagmus.     Left eye: No nystagmus.     Pupils: Pupils are equal, round, and reactive to light.  Cardiovascular:     Rate and Rhythm: Normal rate.     Heart sounds: Normal heart sounds.  Pulmonary:     Effort: Pulmonary effort is normal.  Abdominal:     Palpations: Abdomen is soft.     Tenderness: There is no abdominal tenderness.  Musculoskeletal:        General: Normal range of motion.     Cervical back: Normal range of motion and neck supple.   Lymphadenopathy:     Cervical: No cervical adenopathy.  Skin:    General: Skin is warm and dry.     Findings: No rash.  Neurological:     General: No focal deficit present.     Mental Status: She is alert and oriented to person, place, and time.     GCS: GCS eye subscore is 4. GCS verbal subscore is 5. GCS motor subscore is 6.     Cranial Nerves: No cranial nerve deficit.     Sensory: No sensory deficit.     Coordination: Coordination normal.     Gait: Gait normal.     Deep Tendon Reflexes: Reflexes normal.     Comments: Normal heel-shin, normal rapid alternating movements. Cranial nerves III-XII intact.  No pronator drift.  Psychiatric:        Speech: Speech normal.        Behavior: Behavior normal.        Thought Content: Thought content normal.     (all labs ordered are listed, but only abnormal results are displayed) Labs Reviewed  COMPREHENSIVE METABOLIC PANEL WITH GFR - Abnormal; Notable for the following components:      Result Value   Glucose, Bld 104 (*)    Calcium 8.5 (*)    Total Protein 6.4 (*)    Alkaline Phosphatase 37 (*)    All other components within normal limits  CBC  URINALYSIS, ROUTINE W REFLEX MICROSCOPIC  POC URINE PREG, ED  CBG MONITORING, ED    EKG: None  Radiology: CT Head Wo Contrast Result Date: 03/01/2024 EXAM: CT HEAD WITHOUT CONTRAST 03/01/2024 11:42:57 AM TECHNIQUE: CT of the head was performed without the administration of intravenous contrast. Automated exposure control, iterative reconstruction, and/or weight based adjustment of the mA/kV was utilized to reduce the radiation dose to as low as reasonably achievable. COMPARISON: None available. CLINICAL HISTORY: Syncope/presyncope, cerebrovascular cause suspected. Pt c/o of dizziness that started last night. Pt states it feels like vertigo. Pt took 1 meclizine  and says it did not help. FINDINGS: BRAIN AND VENTRICLES: No acute hemorrhage. Gray-white differentiation is preserved. No  hydrocephalus. No extra-axial collection. No mass effect or midline shift. ORBITS: No acute abnormality. SINUSES: No acute abnormality. SOFT TISSUES AND SKULL: No acute soft tissue abnormality. No skull fracture. IMPRESSION: 1. No acute intracranial abnormality. Electronically signed by: Donnice Mania MD 03/01/2024 11:52 AM EDT RP Workstation: [id number]     Procedures   Medications Ordered in the ED  sodium chloride  0.9 % bolus 1,000 mL (0 mLs Intravenous Stopped 03/01/24 1121)  LORazepam  (ATIVAN ) tablet 0.5 mg (0.5 mg Oral Given 03/01/24 0937)  meclizine  (ANTIVERT ) tablet 25 mg (25 mg Oral Given 03/01/24 0937)  ketorolac  (TORADOL ) 30 MG/ML injection 15 mg (15 mg Intravenous Given 03/01/24 1138)  sodium chloride  0.9 % bolus 1,000 mL (1,000 mLs  Intravenous New Bag/Given 03/01/24 1334)                                    Medical Decision Making Patient presenting with symptoms suggesting peripheral vertigo, she describes several month history of intermittent tinnitus, classic room spinning peripheral vertigo like symptoms which started yesterday evening.  She denies head injury, no fevers, neck pain, no vision changes.  She was given IV fluids along with meclizine  and Ativan .  After these medications she reports a 30 to 50% improvement in her dizziness symptoms, endorsing persistent headache, mild, described as pressure sensation in the top of her head.  Will get CT head to rule out sinus disease, intracranial mass, subdural/subarachnoid, although she has no report of head trauma, so much lower on my differential.  Resulting labs and imaging as outlined below.  Patient did feel improved at time of discharge.  She is prescribed meclizine  and Ativan , caution regarding sedation.  Plan close follow-up with her primary provider if her symptoms are not improving with this treatment plan.  She was also given Epley maneuvers instructions.    Amount and/or Complexity of Data Reviewed Labs: ordered.     Details: Labs reviewed including c-Met, CBC, urinalysis U pride, negative for acute findings. Radiology: ordered.    Details: CT head negative for acute intracranial process.  Risk Prescription drug management.        Final diagnoses:  Vertigo    ED Discharge Orders          Ordered    meclizine  (ANTIVERT ) 25 MG tablet  3 times daily PRN        03/01/24 1534    LORazepam  (ATIVAN ) 1 MG tablet  3 times daily PRN        03/01/24 1534               Keyle Doby, PA-C 03/01/24 1537    Garrick Charleston, MD 03/02/24 1119

## 2024-03-01 NOTE — ED Triage Notes (Signed)
 Pt c/o of dizziness that started last night. Pt states it feels like vertigo. Pt took 1 meclizine  and says it did not help.

## 2024-03-01 NOTE — ED Notes (Signed)
 Patient transported to CT

## 2024-03-01 NOTE — ED Notes (Signed)
 Lying:  BP 96/67  HR 58  Sitting: BP 99/72  HR 61 Standing Bp 106/72 HR 81
